# Patient Record
Sex: Male | Born: 1985 | Race: White | Hispanic: No | Marital: Single | Smoking: Never smoker
Health system: Southern US, Community
[De-identification: ages and names within clinical notes are randomized; demographics above are authoritative.]

## PROBLEM LIST (undated history)

## (undated) HISTORY — PX: APPENDECTOMY: SHX54

---

## 2011-05-04 ENCOUNTER — Ambulatory Visit: Payer: Self-pay | Admitting: Surgery

## 2011-05-09 LAB — PATHOLOGY REPORT

## 2012-05-08 IMAGING — CT CT ABD-PELV W/ CM
1 of 2 series · 15 of 32 positions shown, 19 images · IV contrast (isovue)
Comparison: none

REASON FOR EXAM: (1) abd pain; (2) pel pain
COMMENTS:

PROCEDURE:     CT  - CT ABDOMEN / PELVIS  W  - May 04, 2011 [DATE]
RESULT:     Comparison:  None
TECHNIQUE: Multiple axial images of the abdomen and pelvis were performed
from the lung bases to the pubic symphysis, with p.o. contrast and with 100
mL of Isovue 300 intravenous contrast.

[Series 2: 3mm soft tissue · axial · 0.72mm/px · z∈[-1106,-628]mm · 15 of 173 slices shown, 19 images]
[im 7/173  soft-tissue]
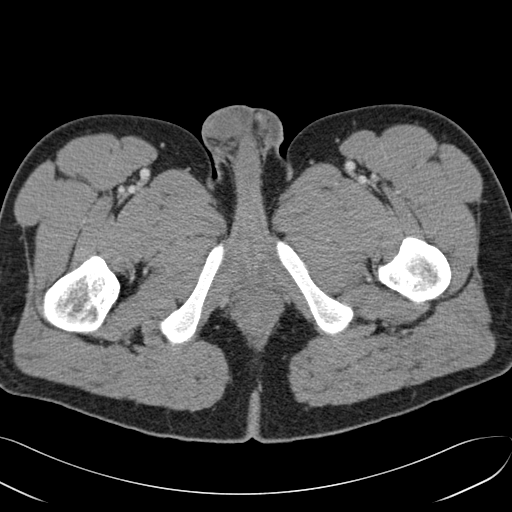
[im 7/173  bone]
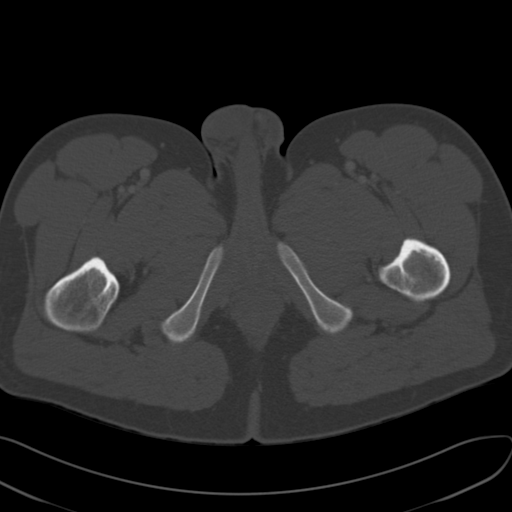
[im 21/173  soft-tissue]
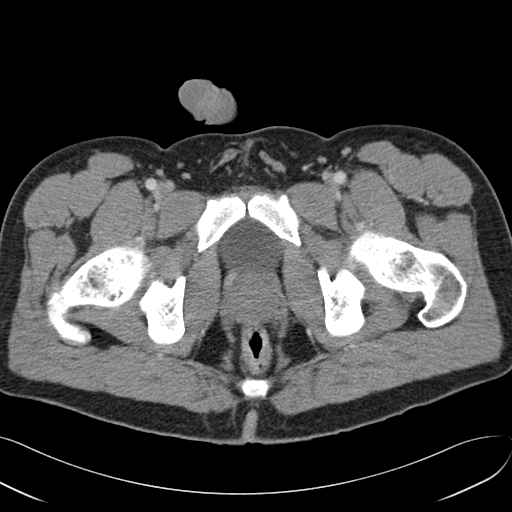
[im 35/173  soft-tissue]
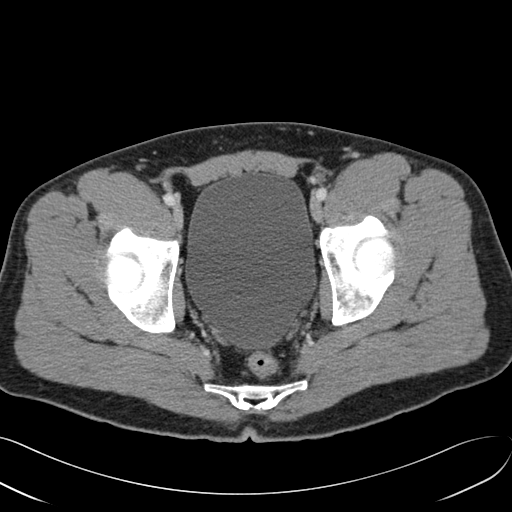
[im 49/173  soft-tissue]
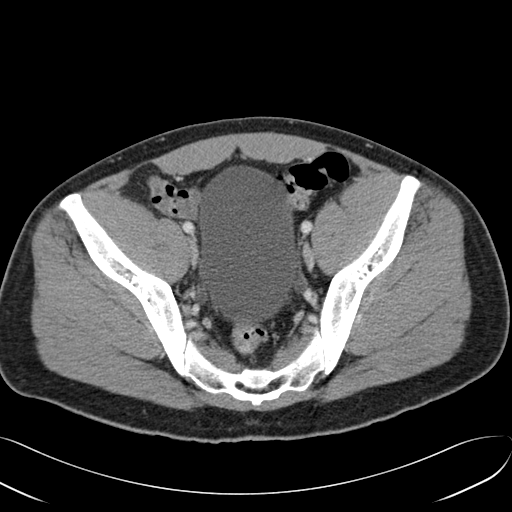
[im 62/173  soft-tissue]
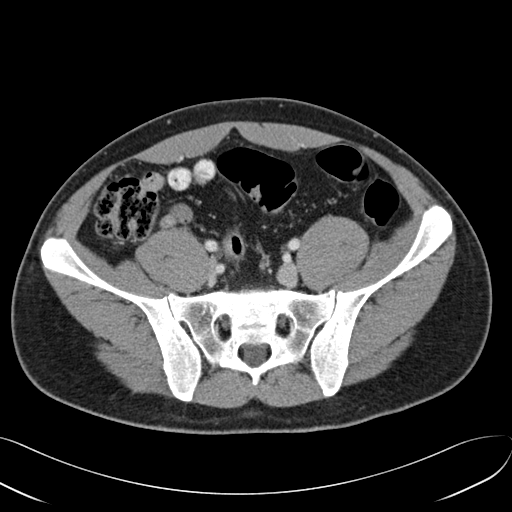
[im 76/173  soft-tissue]
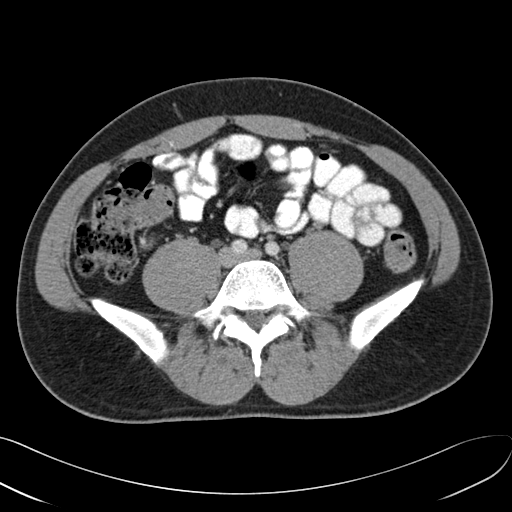
[im 90/173  soft-tissue]
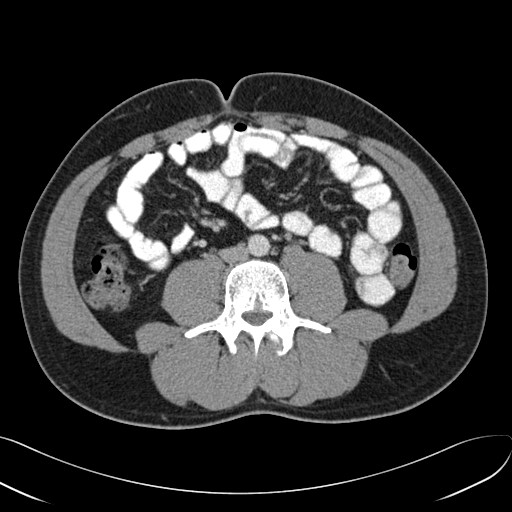
[im 97/173  soft-tissue]
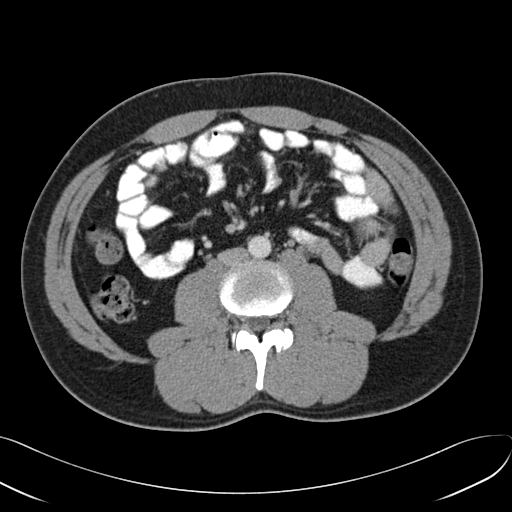
[im 111/173  soft-tissue]
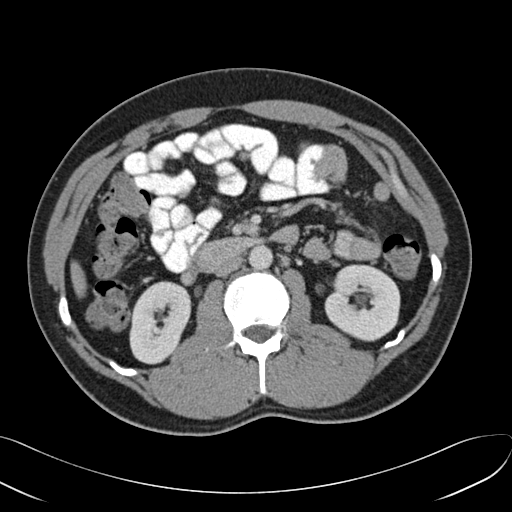
[im 111/173  bone]
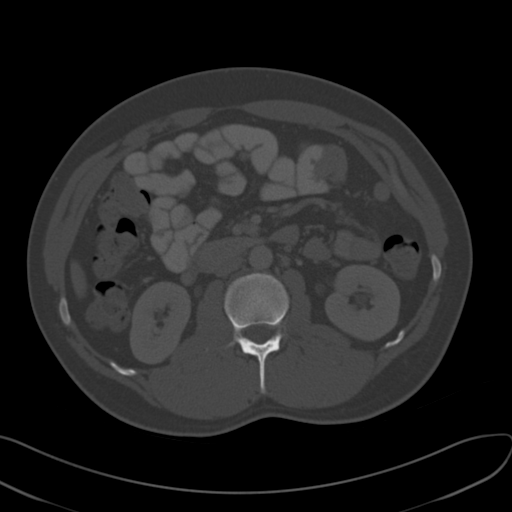
[im 124/173  soft-tissue]
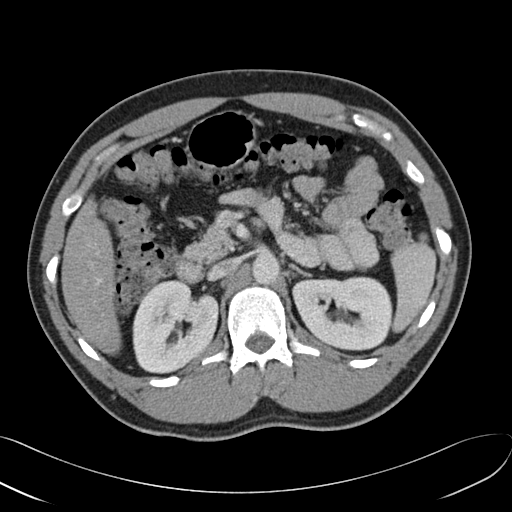
[im 138/173  soft-tissue]
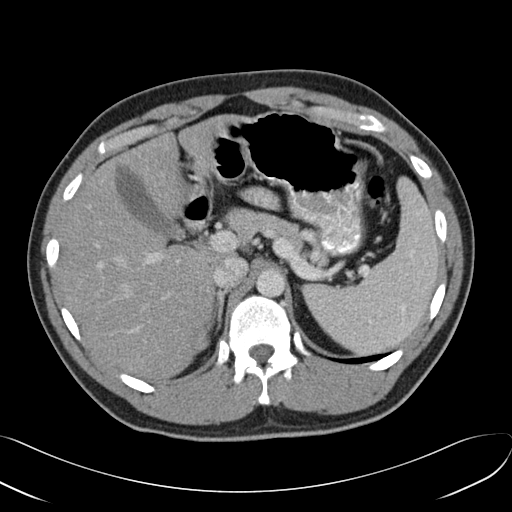
[im 145/173  lung]
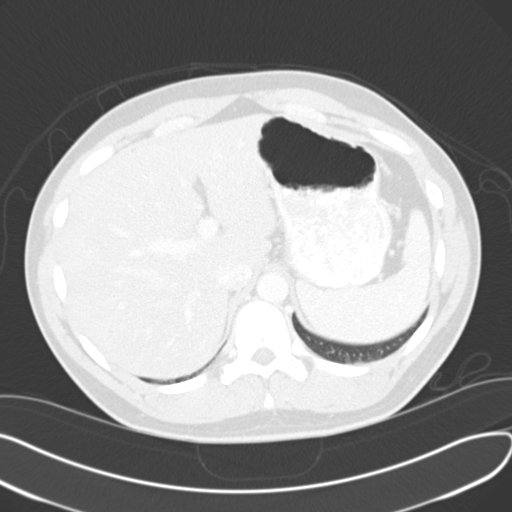
[im 152/173  soft-tissue]
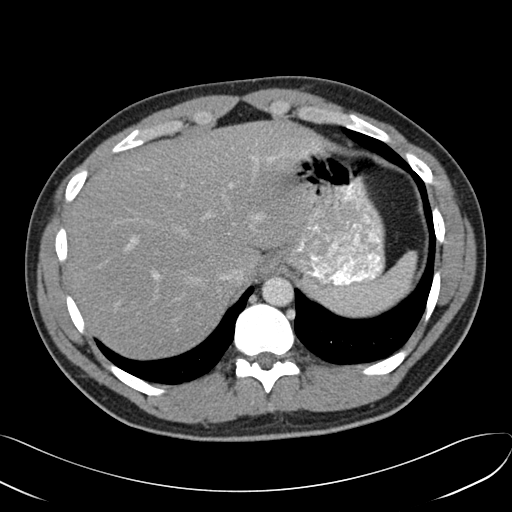
[im 152/173  lung]
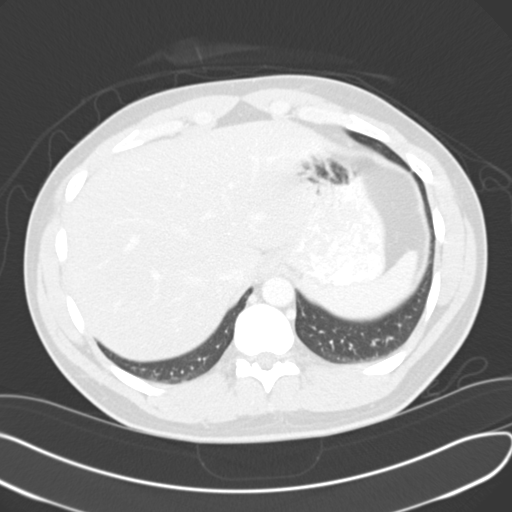
[im 159/173  lung]
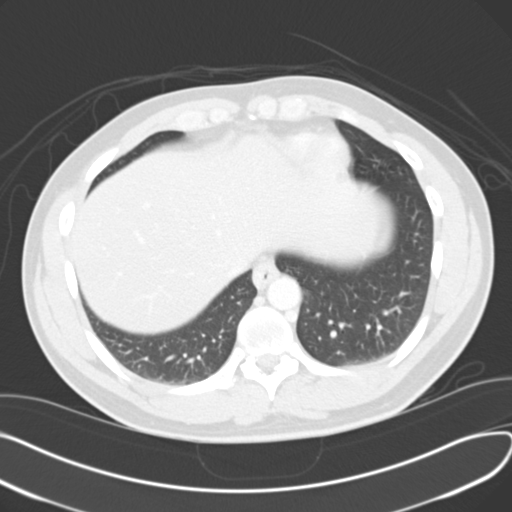
[im 166/173  soft-tissue]
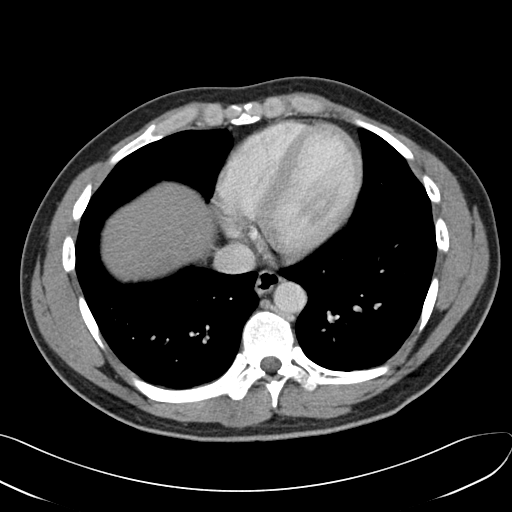
[im 166/173  lung]
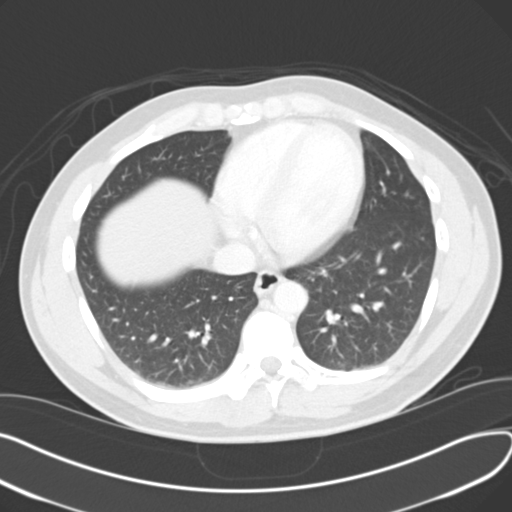

[15 of 32 positions shown; findings below may reference images not displayed]

FINDINGS: The liver is somewhat low in attenuation, suggesting hepatic steatosis. The
gallbladder, spleen, adrenals, and pancreas are unremarkable. The kidneys
enhance normally. No hydronephrosis.

The small and large bowel are normal in caliber. The appendix is enlarged,
measuring 9 mm in diameter. There is an appendicolith in the body of the
appendix. There is suggestion of subtle adjacent stranding.

No aggressive lytic or sclerotic osseous lesions identified.
IMPRESSION: Findings concerning for early changes of acute appendicitis.

This was called to Dr. Amaka Noboa at 2244 hours 05/04/2011

## 2016-06-10 ENCOUNTER — Encounter: Payer: Self-pay | Admitting: Emergency Medicine

## 2016-06-10 ENCOUNTER — Ambulatory Visit
Admission: EM | Admit: 2016-06-10 | Discharge: 2016-06-10 | Disposition: A | Discharge status: Home / Self Care | Payer: Self-pay | Attending: Internal Medicine | Admitting: Internal Medicine

## 2016-06-10 DIAGNOSIS — J020 Streptococcal pharyngitis: Secondary | ICD-10-CM

## 2016-06-10 LAB — RAPID STREP SCREEN (MED CTR MEBANE ONLY): STREPTOCOCCUS, GROUP A SCREEN (DIRECT): POSITIVE — AB

## 2016-06-10 MED ORDER — PENICILLIN G BENZATHINE 1200000 UNIT/2ML IM SUSP
1.2000 10*6.[IU] | Freq: Once | INTRAMUSCULAR | Status: AC
  Administered 2016-06-10: 1.2 10*6.[IU] via INTRAMUSCULAR

## 2016-06-10 NOTE — Discharge Instructions (Addendum)
Recheck for persistent severe sore throat, new fever >100.5, or if not starting to improve in the next 24 hours as anticipated.  Push fluids.

## 2016-06-10 NOTE — ED Notes (Signed)
Patient shows no signs of adverse reaction to medicine at this time.  

## 2016-06-10 NOTE — ED Provider Notes (Signed)
[facility]    CSN: [id number] Arrival date & time: 06/10/16  0901     History   Chief Complaint Chief Complaint  Patient presents with  . Sore Throat    HPI Travis Perkins is a 30 y.o. male. He presents today with onset of bad sore throat the day before yesterday, increasingly severe. No fever, does have some congestion in his nose. No runny nose, no cough. Emesis 1 last evening, none since. Little bit of loose stool. Minimal headache, does have some generalized achiness. Had a lot of episodes of strep throat as a child, none in the last 10 years or so.    HPI  History reviewed. No pertinent past medical history.   Past Surgical History:  Procedure Laterality Date  . APPENDECTOMY         Home Medications   Takes no meds regularly   Family History History reviewed. No pertinent family history.  Social History Social History  Substance Use Topics  . Smoking status: Never Smoker  . Smokeless tobacco: Never Used  . Alcohol use Yes     Allergies   Patient has no known allergies.   Review of Systems Review of Systems  All other systems reviewed and are negative.    Physical Exam Triage Vital Signs ED Triage Vitals  Enc Vitals Group     BP 06/10/16 0915 135/85     Pulse Rate 06/10/16 0915 99     Resp 06/10/16 0915 16     Temp 06/10/16 0915 98.6 F (37 C)     Temp Source 06/10/16 0915 Oral     SpO2 06/10/16 0915 99 %     Weight 06/10/16 0914 215 lb (97.5 kg)     Height 06/10/16 0914 5\' 10"  (1.778 m)     Pain Score 06/10/16 0915 4   Updated Vital Signs BP 135/85 (BP Location: Left Arm)   Pulse 99   Temp 98.6 F (37 C) (Oral)   Resp 16   Ht 5\' 10"  (1.778 m)   Wt 215 lb (97.5 kg)   SpO2 99%   BMI 30.85 kg/m  Physical Exam  Constitutional: He is oriented to person, place, and time. No distress.  Alert, nicely groomed  HENT:  Head: Atraumatic.  Throat is red with large tonsils, beefy red, with exudate  Eyes:  Conjugate gaze,  no eye redness/drainage  Neck: Neck supple.  Cardiovascular: Normal rate.   Pulmonary/Chest: No respiratory distress.  Abdominal: He exhibits no distension.  Musculoskeletal: Normal range of motion.  Neurological: He is alert and oriented to person, place, and time.  Skin: Skin is warm and dry.  No cyanosis  Nursing note and vitals reviewed.    UC Treatments / Results  Labs (all labs ordered are listed, but only abnormal results are displayed) Labs Reviewed  RAPID STREP SCREEN (NOT AT [facility]) - Abnormal; Notable for the following:       Result Value   Streptococcus, Group A Screen (Direct) POSITIVE (*)    All other components within normal limits    Procedures Procedures (including critical care time)  Medications Ordered in UC Medications  penicillin g benzathine (BICILLIN LA) 1200000 UNIT/2ML injection 1.2 Million Units (1.2 Million Units Intramuscular Given 06/10/16 0945)      Final Clinical Impressions(s) / UC Diagnoses   Final diagnoses:  Strep pharyngitis   Recheck for persistent severe sore throat, new fever >100.5, or if not starting to improve in the next 24 hours as anticipated.  Push fluids.     Eustace MooreLaura W Renay Crammer, MD 06/11/16 [phone]

## 2016-06-10 NOTE — ED Triage Notes (Signed)
Patient c/o sore throat since Wed.  Patient denies fevers  

## 2016-07-14 ENCOUNTER — Emergency Department: Payer: Worker's Compensation

## 2016-07-14 ENCOUNTER — Encounter: Payer: Self-pay | Admitting: Emergency Medicine

## 2016-07-14 ENCOUNTER — Emergency Department
Admission: EM | Admit: 2016-07-14 | Discharge: 2016-07-14 | Disposition: A | Discharge status: Home / Self Care | Payer: Worker's Compensation | Attending: Emergency Medicine | Admitting: Emergency Medicine

## 2016-07-14 DIAGNOSIS — Y929 Unspecified place or not applicable: Secondary | ICD-10-CM | POA: Diagnosis not present

## 2016-07-14 DIAGNOSIS — S62639B Displaced fracture of distal phalanx of unspecified finger, initial encounter for open fracture: Secondary | ICD-10-CM

## 2016-07-14 DIAGNOSIS — W290XXA Contact with powered kitchen appliance, initial encounter: Secondary | ICD-10-CM | POA: Insufficient documentation

## 2016-07-14 DIAGNOSIS — Y9389 Activity, other specified: Secondary | ICD-10-CM | POA: Insufficient documentation

## 2016-07-14 DIAGNOSIS — S62521B Displaced fracture of distal phalanx of right thumb, initial encounter for open fracture: Secondary | ICD-10-CM | POA: Diagnosis not present

## 2016-07-14 DIAGNOSIS — Z23 Encounter for immunization: Secondary | ICD-10-CM | POA: Diagnosis not present

## 2016-07-14 DIAGNOSIS — Y990 Civilian activity done for income or pay: Secondary | ICD-10-CM | POA: Diagnosis not present

## 2016-07-14 DIAGNOSIS — S6991XA Unspecified injury of right wrist, hand and finger(s), initial encounter: Secondary | ICD-10-CM | POA: Diagnosis present

## 2016-07-14 MED ORDER — HYDROMORPHONE HCL 1 MG/ML IJ SOLN
0.5000 mg | Freq: Once | INTRAMUSCULAR | Status: AC
  Administered 2016-07-14: 0.5 mg via INTRAVENOUS
  Filled 2016-07-14: qty 1

## 2016-07-14 MED ORDER — TETANUS-DIPHTH-ACELL PERTUSSIS 5-2.5-18.5 LF-MCG/0.5 IM SUSP
0.5000 mL | Freq: Once | INTRAMUSCULAR | Status: AC
  Administered 2016-07-14: 0.5 mL via INTRAMUSCULAR
  Filled 2016-07-14: qty 0.5

## 2016-07-14 MED ORDER — ONDANSETRON HCL 4 MG/2ML IJ SOLN
4.0000 mg | Freq: Once | INTRAMUSCULAR | Status: AC
  Administered 2016-07-14: 4 mg via INTRAVENOUS
  Filled 2016-07-14: qty 2

## 2016-07-14 MED ORDER — OXYCODONE-ACETAMINOPHEN 5-325 MG PO TABS: 1.0000 | ORAL_TABLET | Freq: Four times a day (QID) | ORAL | 0 refills | Status: AC | PRN

## 2016-07-14 MED ORDER — CEPHALEXIN 500 MG PO CAPS: 500.0000 mg | ORAL_CAPSULE | Freq: Four times a day (QID) | ORAL | 0 refills | Status: AC

## 2016-07-14 MED ORDER — SULFAMETHOXAZOLE-TRIMETHOPRIM 800-160 MG PO TABS: 1.0000 | ORAL_TABLET | Freq: Two times a day (BID) | ORAL | 0 refills | Status: AC

## 2016-07-14 MED ORDER — CEFAZOLIN IN D5W 1 GM/50ML IV SOLN
1.0000 g | Freq: Once | INTRAVENOUS | Status: AC
  Administered 2016-07-14: 1 g via INTRAVENOUS
  Filled 2016-07-14: qty 50

## 2016-07-14 NOTE — ED Triage Notes (Signed)
Pt here after right thumb tip amputation while at work today. Pt unsure of last tetanus.

## 2016-07-14 NOTE — Consult Note (Addendum)
  ORTHOPAEDIC CONSULTATION  REQUESTING PHYSICIAN: Travis NatalPaul F Malinda, MD  Chief Complaint: Right thumb injury  HPI: Travis HerterChris Perkins is a 31 y.o. male who injured his right thumb while using a grinder at work. He sustained an amputation of the tip of the right thumb.  Patient is seen with the emergency room doctor, Travis Glassmanaul Perkins.  History reviewed. No pertinent past medical history. Past Surgical History:  Procedure Laterality Date  . APPENDECTOMY     Social History   Social History  . Marital status: Single    Spouse name: N/A  . Number of children: N/A  . Years of education: N/A   Social History Main Topics  . Smoking status: Never Smoker  . Smokeless tobacco: Never Used  . Alcohol use Yes  . Drug use: No  . Sexual activity: Not Asked   Other Topics Concern  . None   Social History Narrative  . None   No family history on file. No Known Allergies Prior to Admission medications   Not on File   Dg Finger Thumb Right  Result Date: 07/14/2016 CLINICAL DATA:  Distal right thumb amputation due to an injury today. EXAM: RIGHT THUMB 2+V COMPARISON:  None. FINDINGS: The distal right thumb is missing, including the portion of the distal tuft. There are multiple tiny metallic foreign bodies on or within the soft tissues at the site of amputation. No fracture in the remaining portions of the bone. IMPRESSION: Distal right thumb amputation with multiple tiny metallic foreign bodies. Electronically Signed   By: Travis SaltsSteven  Perkins M.D.   On: 07/14/2016 18:38    Positive ROS: All other systems have been reviewed and were otherwise negative with the exception of those mentioned in the HPI and as above.  Physical Exam: General: Alert, no acute distress  MUSCULOSKELETAL: Right thumb: Patient has a transverse amputation to the distal right thumb through the distal phalanx. Half the nail plate remains. There is no active bleeding. Patient's thumb is well-perfused. There is no obvious bone extending  from the wound. The IP joint of the thumb is not involved.  Assessment: Distal Right thumb amputation  Plan: I personally examined the patient's right thumb. He has a transverse amputation of the distal tip of the right thumb. I recommend the patient have the wound cleaned. He should receive IV Ancef while in the ER. He should have a tetanus shot as he does not know when his last one was. Patient should have a sterile bandage applied to the right thumb. He should be discharged on Keflex 500 mg by mouth 4 times a day and Septra double strength 1 tablet twice a day and follow up with me in the office next Tuesday. She was instructed to return to the ER if he shows any signs of infection over the next few days.  I have reviewed the patient's x-rays. There does not appear to be any prominent bone beyond the level of the amputation. There are tiny metallic fragments at the site of the amputation.    Travis Perkins, Travis Haapala, MD    07/14/2016 6:42 PM

## 2016-07-14 NOTE — Discharge Instructions (Signed)
Take the Keflex one pill 4 times a day and the Bactrim 1 pill twice a day to help prevent infection. Return for any redness, streaks up the arm, markedly increased pain or swelling. Use the Percocet 1-2 pills 4 times a day as needed for pain. Because the Percocet can make you woozy and constipated. Do not drive if you're taking it. If I let all or Advil do okay by themselves do not take the Percocet. Do not take Tylenol and Percocet together because it can be too much Tylenol and it will destroy your liver.  When you call the orthopedic surgeon's office, tell them that he came to see you in the ER. Tell them that he wanted to see you on Tuesday and that he said to work you in.

## 2016-07-14 NOTE — ED Provider Notes (Signed)
[facility] Emergency Department Provider Note   ____________________________________________   First MD Initiated Contact with Patient 07/14/16 1802     (approximate)  I have reviewed the triage vital signs and the nursing notes.   HISTORY  Chief Complaint Finger Injury   HPI Travis Perkins is a 31 y.o. male ports he was working in his shop when his thumb was sucked into the grinder and basically ground off the distal tip of his thumb halfway down the nail. This just happened patient came to the emergency room immediately has no other injuries it does hurt fairly severely. He does not remember his last tetanus shot.   History reviewed. No pertinent past medical history.  There are no active problems to display for this patient.   Past Surgical History:  Procedure Laterality Date  . APPENDECTOMY      Prior to Admission medications   Medication Sig Start Date End Date Taking? Authorizing Provider  cephALEXin (KEFLEX) 500 MG capsule Take 1 capsule (500 mg total) by mouth 4 (four) times daily. 07/14/16 07/24/16  [name], MD  oxyCODONE-acetaminophen (ROXICET) 5-325 MG tablet Take 1 tablet by mouth every 6 (six) hours as needed. 07/14/16 07/14/17  [name], MD  sulfamethoxazole-trimethoprim (BACTRIM DS,SEPTRA DS) 800-160 MG tablet Take 1 tablet by mouth 2 (two) times daily. 07/14/16   [name], MD    Allergies Patient has no known allergies.  No family history on file.  Social History Social History  Substance Use Topics  . Smoking status: Never Smoker  . Smokeless tobacco: Never Used  . Alcohol use Yes    Review of Systems Constitutional: No fever/chills Eyes: No visual changes. ENT: No sore throat. Cardiovascular: Denies chest pain. Respiratory: Denies shortness of breath. Gastrointestinal: No abdominal pain.  No nausea, no vomiting.  No diarrhea.  No constipation. Genitourinary: Negative for dysuria. Musculoskeletal:  Negative for back pain. Skin: Negative for rash. Neurological: Negative for headaches, focal weakness or numbness.  10-point ROS otherwise negative.  ____________________________________________   PHYSICAL EXAM:  VITAL SIGNS: ED Triage Vitals [07/14/16 1808]  Enc Vitals Group     BP (!) 150/97     Pulse Rate 78     Resp 16     Temp 98.4 F (36.9 C)     Temp Source Oral     SpO2 99 %     Weight 210 lb (95.3 kg)     Height 5\' 11"  (1.803 m)     Head Circumference      Peak Flow      Pain Score 10     Pain Loc      Pain Edu?      Excl. in GC?     Constitutional: Alert and oriented. Well appearing and in no acute distress. Eyes: Conjunctivae are normal. PERRL. EOMI. Head: Atraumatic. Nose: No congestion/rhinnorhea. Mouth/Throat: Mucous membranes are moist.   Neck: No stridor. Cardiovascular: Normal rate, regular rhythm.   Good peripheral circulation. Respiratory: Normal respiratory effort.  No retractions.  Gastrointestinal: Soft and nontender. No distention. No abdominal bruits. No CVA tenderness. Right thumb has a ragged avulsion. Dr. Martha ClanKrasinski came down and looked at the thumb with me and advised me to swish it in Betadine and water mixture and then bandaged it up and he will see it on Tuesday in the office. Patient is to return here for increasing pain redness streaks up the arm etc. If they come on before he sees him on  Tuesday. ____________________________________________   LABS (all labs ordered are listed, but only abnormal results are displayed)  Labs Reviewed - No data to display ____________________________________________  EKG   ____________________________________________  RADIOLOGY  Study Result   CLINICAL DATA:  Distal right thumb amputation due to an injury today.  EXAM: RIGHT THUMB 2+V  COMPARISON:  None.  FINDINGS: The distal right thumb is missing, including the portion of the distal tuft. There are multiple tiny metallic foreign  bodies on or within the soft tissues at the site of amputation. No fracture in the remaining portions of the bone.  IMPRESSION: Distal right thumb amputation with multiple tiny metallic foreign bodies.   Electronically Signed   By: Beckie Salts M.D.   On: 07/14/2016 18:38    ____________________________________________   PROCEDURES  Procedure(s) performed:  Procedures  Critical Care performed:   ____________________________________________   INITIAL IMPRESSION / ASSESSMENT AND PLAN / ED COURSE  Pertinent labs & imaging results that were available during my care of the patient were reviewed by me and considered in my medical decision making (see chart for details).  Let the thumb be opened and closed the delayed primary closure is anticipated      ____________________________________________   FINAL CLINICAL IMPRESSION(S) / ED DIAGNOSES  Final diagnoses:  Open fracture of tuft of distal phalanx of finger   Provisional diagnosis   NEW MEDICATIONS STARTED DURING THIS VISIT:  New Prescriptions   CEPHALEXIN (KEFLEX) 500 MG CAPSULE    Take 1 capsule (500 mg total) by mouth 4 (four) times daily.   OXYCODONE-ACETAMINOPHEN (ROXICET) 5-325 MG TABLET    Take 1 tablet by mouth every 6 (six) hours as needed.   SULFAMETHOXAZOLE-TRIMETHOPRIM (BACTRIM DS,SEPTRA DS) 800-160 MG TABLET    Take 1 tablet by mouth 2 (two) times daily.     Note:  This document was prepared using Dragon voice recognition software and may include unintentional dictation errors.    Arnaldo Natal, MD 07/14/16 [phone]

## 2017-07-19 IMAGING — DX DG FINGER THUMB 2+V*R*
3 series · 3 of 3 positions shown · non-contrast
Comparison: None.

CLINICAL DATA: Distal right thumb amputation due to an injury
today.

EXAM:
RIGHT THUMB 2+V

[finger ap]
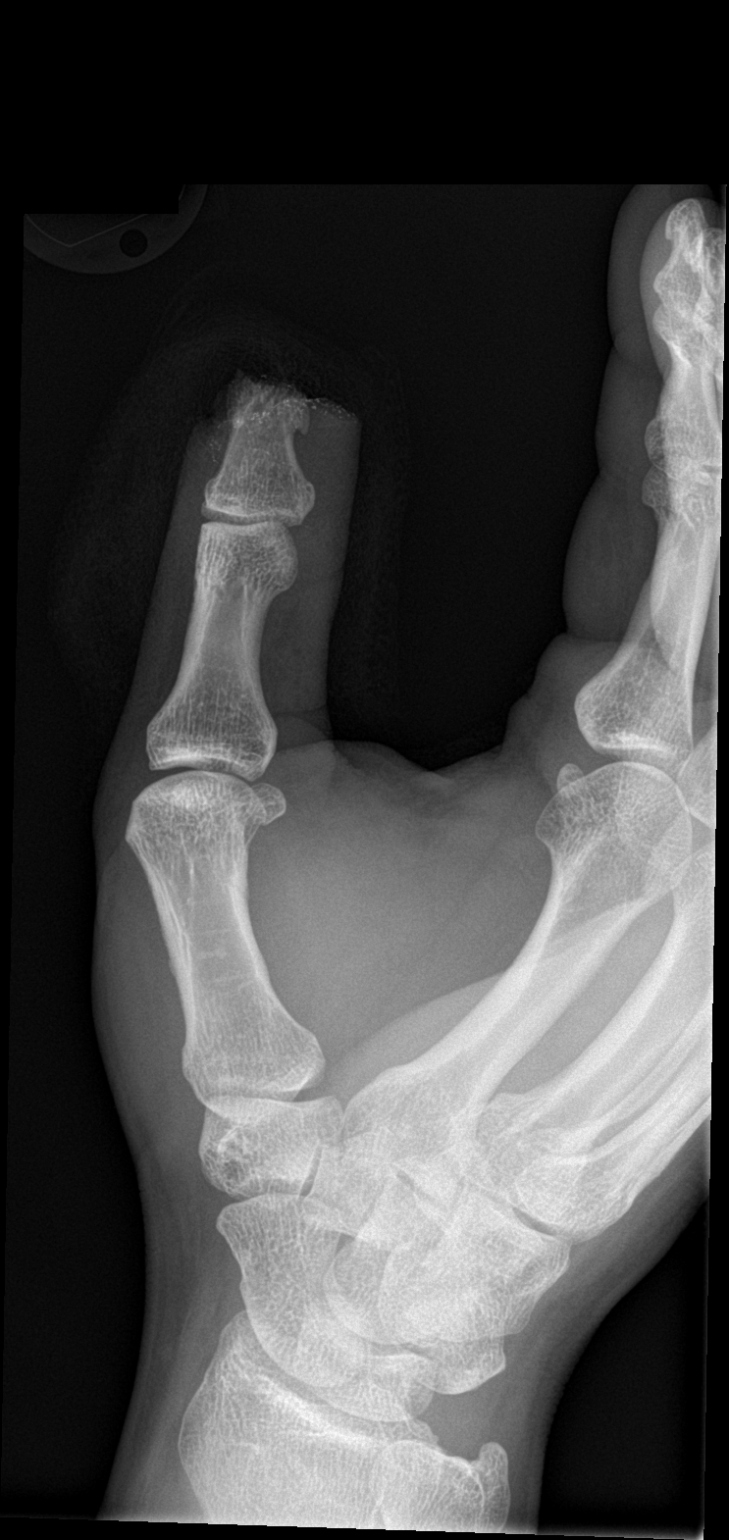

[finger obl]
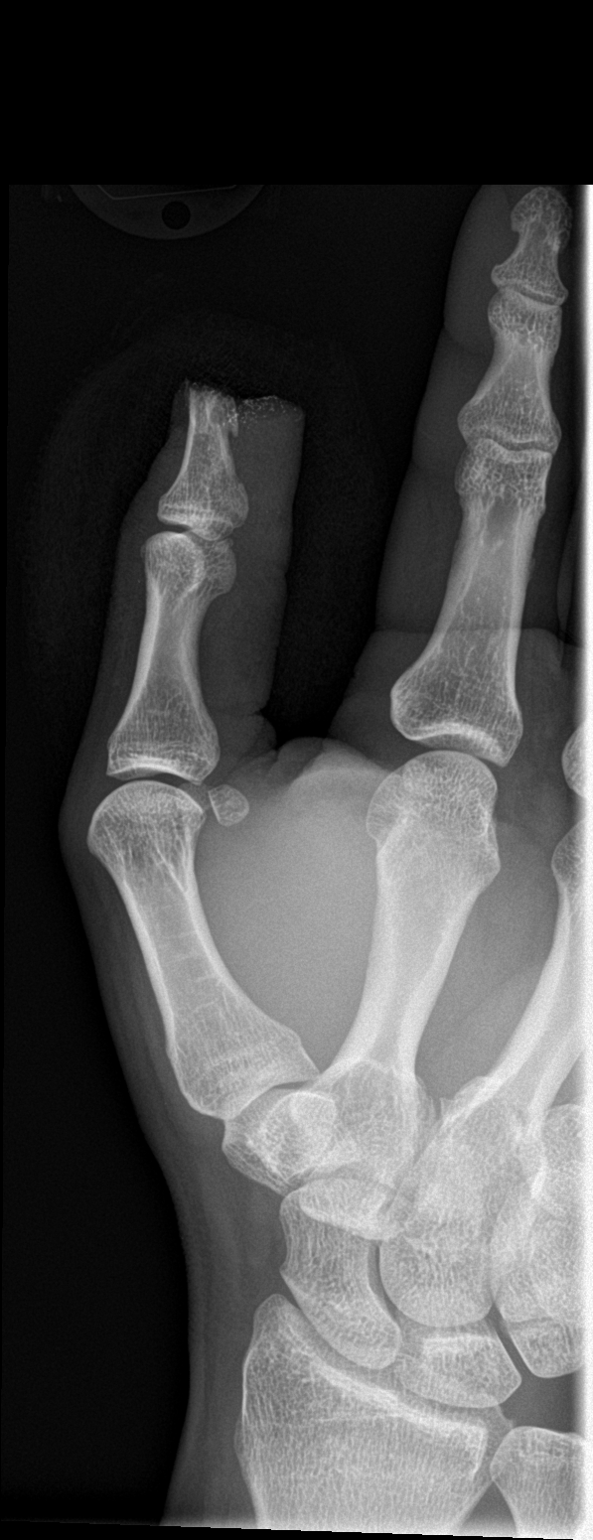

[finger lat]
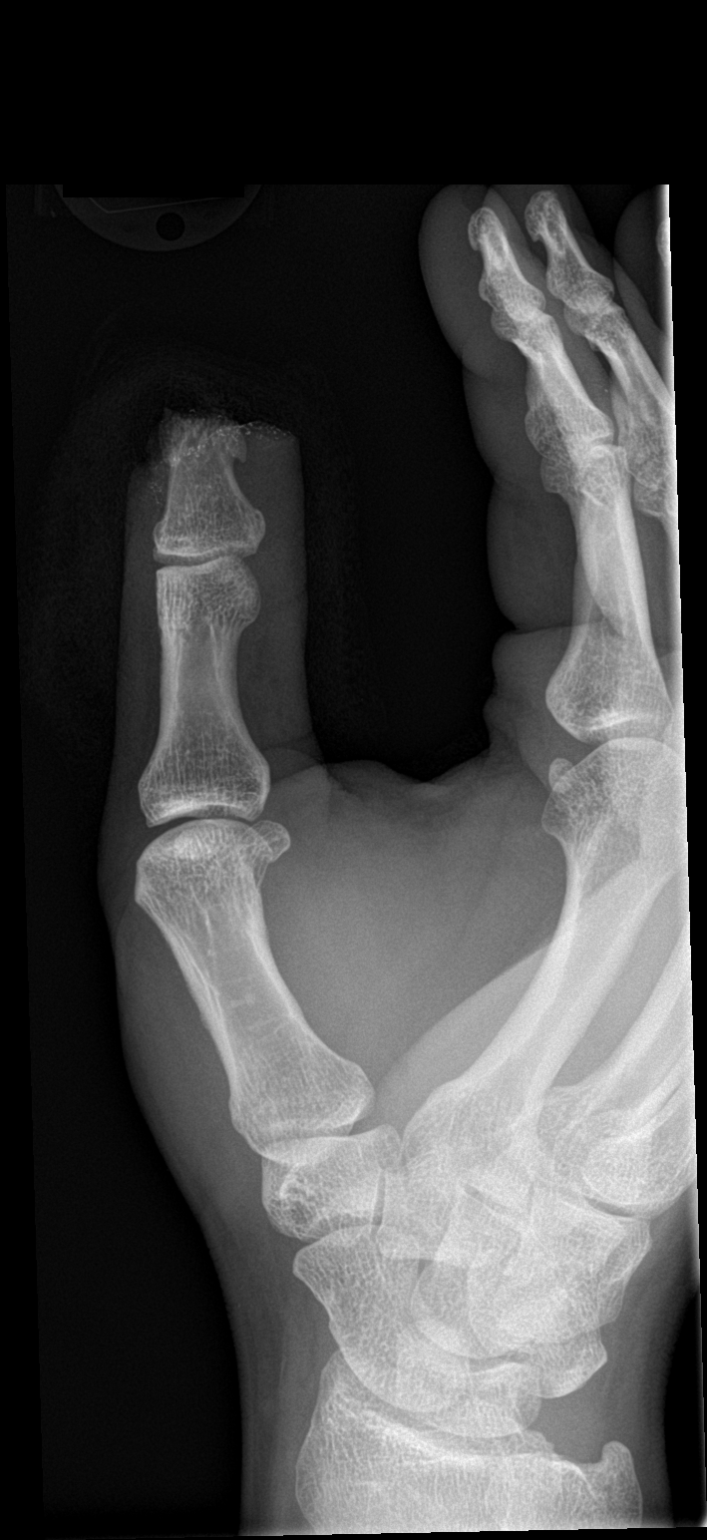

[3 of 3 positions shown; findings below may reference images not displayed]

FINDINGS: The distal right thumb is missing, including the portion of the
distal tuft. There are multiple tiny metallic foreign bodies on or
within the soft tissues at the site of amputation. No fracture in
the remaining portions of the bone.
IMPRESSION: Distal right thumb amputation with multiple tiny metallic foreign
bodies.
